# Patient Record
Sex: Female | Born: 2000 | Race: Black or African American | Hispanic: No | Marital: Single | Smoking: Never smoker
Health system: Southern US, Community
[De-identification: ages and names within clinical notes are randomized; demographics above are authoritative.]

---

## 2019-11-29 ENCOUNTER — Other Ambulatory Visit: Payer: Self-pay

## 2019-11-29 ENCOUNTER — Encounter (HOSPITAL_BASED_OUTPATIENT_CLINIC_OR_DEPARTMENT_OTHER): Payer: Self-pay

## 2019-11-29 ENCOUNTER — Emergency Department (HOSPITAL_BASED_OUTPATIENT_CLINIC_OR_DEPARTMENT_OTHER)
Admission: EM | Admit: 2019-11-29 | Discharge: 2019-11-29 | Disposition: A | Discharge status: Home / Self Care | Payer: Self-pay | Attending: Emergency Medicine | Admitting: Emergency Medicine

## 2019-11-29 DIAGNOSIS — R6883 Chills (without fever): Secondary | ICD-10-CM | POA: Insufficient documentation

## 2019-11-29 DIAGNOSIS — R519 Headache, unspecified: Secondary | ICD-10-CM

## 2019-11-29 DIAGNOSIS — Z20822 Contact with and (suspected) exposure to covid-19: Secondary | ICD-10-CM | POA: Insufficient documentation

## 2019-11-29 DIAGNOSIS — R319 Hematuria, unspecified: Secondary | ICD-10-CM

## 2019-11-29 DIAGNOSIS — N390 Urinary tract infection, site not specified: Secondary | ICD-10-CM | POA: Insufficient documentation

## 2019-11-29 LAB — CBC WITH DIFFERENTIAL/PLATELET
Abs Immature Granulocytes: 0.02 10*3/uL (ref 0.00–0.07)
Basophils Absolute: 0 10*3/uL (ref 0.0–0.1)
Basophils Relative: 0 %
Eosinophils Absolute: 0 10*3/uL (ref 0.0–0.5)
Eosinophils Relative: 0 %
HCT: 42.2 % (ref 36.0–46.0)
Hemoglobin: 13.9 g/dL (ref 12.0–15.0)
Immature Granulocytes: 0 %
Lymphocytes Relative: 18 %
Lymphs Abs: 1.6 10*3/uL (ref 0.7–4.0)
MCH: 21.2 pg — ABNORMAL LOW (ref 26.0–34.0)
MCHC: 32.9 g/dL (ref 30.0–36.0)
MCV: 64.3 fL — ABNORMAL LOW (ref 80.0–100.0)
Monocytes Absolute: 0.6 10*3/uL (ref 0.1–1.0)
Monocytes Relative: 6 %
Neutro Abs: 6.6 10*3/uL (ref 1.7–7.7)
Neutrophils Relative %: 76 %
Platelets: 327 10*3/uL (ref 150–400)
RBC: 6.56 MIL/uL — ABNORMAL HIGH (ref 3.87–5.11)
RDW: 16.4 % — ABNORMAL HIGH (ref 11.5–15.5)
WBC: 8.7 10*3/uL (ref 4.0–10.5)
nRBC: 0 % (ref 0.0–0.2)

## 2019-11-29 LAB — SARS CORONAVIRUS 2 BY RT PCR (HOSPITAL ORDER, PERFORMED IN ~~LOC~~ HOSPITAL LAB): SARS Coronavirus 2: NEGATIVE

## 2019-11-29 LAB — BASIC METABOLIC PANEL
Anion gap: 12 (ref 5–15)
BUN: 13 mg/dL (ref 6–20)
CO2: 25 mmol/L (ref 22–32)
Calcium: 9.4 mg/dL (ref 8.9–10.3)
Chloride: 100 mmol/L (ref 98–111)
Creatinine, Ser: 0.88 mg/dL (ref 0.44–1.00)
GFR calc Af Amer: >60 mL/min (ref >60)
GFR calc non Af Amer: >60 mL/min (ref >60)
Glucose, Bld: 93 mg/dL (ref 70–99)
Potassium: 3.6 mmol/L (ref 3.5–5.1)
Sodium: 137 mmol/L (ref 135–145)

## 2019-11-29 LAB — URINALYSIS, ROUTINE W REFLEX MICROSCOPIC
Specific Gravity, Urine: 1.025 (ref 1.005–1.030)
pH: 6.5 (ref 5.0–8.0)

## 2019-11-29 LAB — URINALYSIS, MICROSCOPIC (REFLEX): RBC / HPF: >50 RBC/hpf (ref 0–5)

## 2019-11-29 LAB — PREGNANCY, URINE: Preg Test, Ur: NEGATIVE

## 2019-11-29 MED ORDER — KETOROLAC TROMETHAMINE 15 MG/ML IJ SOLN
15.0000 mg | Freq: Once | INTRAMUSCULAR | Status: AC
  Administered 2019-11-29: 15 mg via INTRAVENOUS
  Filled 2019-11-29: qty 1

## 2019-11-29 MED ORDER — CEPHALEXIN 500 MG PO CAPS: 500.0000 mg | ORAL_CAPSULE | Freq: Four times a day (QID) | ORAL | 0 refills | Status: AC

## 2019-11-29 MED ORDER — DIPHENHYDRAMINE HCL 50 MG/ML IJ SOLN
12.5000 mg | Freq: Once | INTRAMUSCULAR | Status: AC
  Administered 2019-11-29: 12.5 mg via INTRAVENOUS
  Filled 2019-11-29: qty 1

## 2019-11-29 MED ORDER — PROCHLORPERAZINE EDISYLATE 10 MG/2ML IJ SOLN
10.0000 mg | Freq: Once | INTRAMUSCULAR | Status: AC
  Administered 2019-11-29: 10 mg via INTRAVENOUS
  Filled 2019-11-29: qty 2

## 2019-11-29 MED ORDER — SODIUM CHLORIDE 0.9 % IV BOLUS
1000.0000 mL | Freq: Once | INTRAVENOUS | Status: AC
  Administered 2019-11-29: 1000 mL via INTRAVENOUS

## 2019-11-29 MED ORDER — ONDANSETRON 4 MG PO TBDP: 4.0000 mg | ORAL_TABLET | Freq: Three times a day (TID) | ORAL | 0 refills | Status: AC | PRN

## 2019-11-29 NOTE — ED Triage Notes (Signed)
Pt arrives with c/o headache for 2-3 days, c/o fatigue.

## 2019-11-29 NOTE — ED Provider Notes (Signed)
[facility] EMERGENCY DEPARTMENT Provider Note   CSN: [id number] Arrival date & time: 11/29/19  1524     History Chief Complaint  Patient presents with  . Headache    Lori Welch is a 19 y.o. female.  Presents to ER with concern for headache, fatigue, nausea.  Reports symptoms ongoing for the last 2 or 3 days.  Headache is not sudden onset, not worst headache of her life.  Dull achy.  She has no associated neck stiffness, neck pain, fever.  Has had some occasional chills.  No abdominal pain or chest pain.  HPI     History reviewed. No pertinent past medical history.  There are no problems to display for this patient.   History reviewed. No pertinent surgical history.   OB History   No obstetric history on file.     No family history on file.  Social History   Tobacco Use  . Smoking status: Never Smoker  . Smokeless tobacco: Never Used  Vaping Use  . Vaping Use: Never used  Substance Use Topics  . Alcohol use: Yes  . Drug use: Yes    Types: Marijuana    Home Medications Prior to Admission medications   Medication Sig Start Date End Date Taking? Authorizing Provider  cephALEXin (KEFLEX) 500 MG capsule Take 1 capsule (500 mg total) by mouth 4 (four) times daily for 5 days. 11/29/19 12/04/19  Milagros Loll, MD  ondansetron (ZOFRAN ODT) 4 MG disintegrating tablet Take 1 tablet (4 mg total) by mouth every 8 (eight) hours as needed for up to 7 days for nausea or vomiting. 11/29/19 12/06/19  Milagros Loll, MD    Allergies    Patient has no known allergies.  Review of Systems   Review of Systems  Constitutional: Positive for chills and fatigue. Negative for fever.  HENT: Negative for ear pain and sore throat.   Eyes: Negative for pain and visual disturbance.  Respiratory: Negative for cough and shortness of breath.   Cardiovascular: Negative for chest pain and palpitations.  Gastrointestinal: Positive for nausea. Negative for abdominal pain and  vomiting.  Genitourinary: Negative for dysuria and hematuria.  Musculoskeletal: Negative for arthralgias and back pain.  Skin: Negative for color change and rash.  Neurological: Positive for headaches. Negative for seizures and syncope.  All other systems reviewed and are negative.   Physical Exam Updated Vital Signs BP 113/84 (BP Location: Left Arm)   Pulse 95   Temp 98.3 F (36.8 C) (Oral)   Resp 20   Ht 5\' 9"  (1.753 m)   Wt 73 kg   LMP 11/29/2019   SpO2 98%   BMI 23.76 kg/m   Physical Exam Vitals and nursing note reviewed.  Constitutional:      General: She is not in acute distress.    Appearance: She is well-developed.  HENT:     Head: Normocephalic and atraumatic.  Eyes:     Conjunctiva/sclera: Conjunctivae normal.  Cardiovascular:     Rate and Rhythm: Normal rate and regular rhythm.     Heart sounds: No murmur heard.   Pulmonary:     Effort: Pulmonary effort is normal. No respiratory distress.     Breath sounds: Normal breath sounds.  Abdominal:     Palpations: Abdomen is soft.     Tenderness: There is no abdominal tenderness.  Musculoskeletal:     Cervical back: Neck supple.  Skin:    General: Skin is warm and dry.  Neurological:  Mental Status: She is alert.     Comments: AAOx3 CN 2-12 intact, speech clear visual fields intact 5/5 strength in b/l UE and LE Sensation to light touch intact in b/l UE and LE Normal FNF Normal gait  Psychiatric:        Mood and Affect: Mood normal.        Behavior: Behavior normal.     ED Results / Procedures / Treatments   Labs (all labs ordered are listed, but only abnormal results are displayed) Labs Reviewed  CBC WITH DIFFERENTIAL/PLATELET - Abnormal; Notable for the following components:      Result Value   RBC 6.56 (*)    MCV 64.3 (*)    MCH 21.2 (*)    RDW 16.4 (*)    All other components within normal limits  URINALYSIS, ROUTINE W REFLEX MICROSCOPIC - Abnormal; Notable for the following components:     Color, Urine BROWN (*)    APPearance TURBID (*)    Glucose, UA   (*)    Value: TEST NOT REPORTED DUE TO COLOR INTERFERENCE OF URINE PIGMENT   Hgb urine dipstick   (*)    Value: TEST NOT REPORTED DUE TO COLOR INTERFERENCE OF URINE PIGMENT   Bilirubin Urine   (*)    Value: TEST NOT REPORTED DUE TO COLOR INTERFERENCE OF URINE PIGMENT   Ketones, ur   (*)    Value: TEST NOT REPORTED DUE TO COLOR INTERFERENCE OF URINE PIGMENT   Protein, ur   (*)    Value: TEST NOT REPORTED DUE TO COLOR INTERFERENCE OF URINE PIGMENT   Nitrite   (*)    Value: TEST NOT REPORTED DUE TO COLOR INTERFERENCE OF URINE PIGMENT   Leukocytes,Ua   (*)    Value: TEST NOT REPORTED DUE TO COLOR INTERFERENCE OF URINE PIGMENT   All other components within normal limits  URINALYSIS, MICROSCOPIC (REFLEX) - Abnormal; Notable for the following components:   Bacteria, UA MANY (*)    All other components within normal limits  SARS CORONAVIRUS 2 BY RT PCR (HOSPITAL ORDER, PERFORMED IN [facility])  BASIC METABOLIC PANEL  PREGNANCY, URINE    EKG EKG Interpretation  Date/Time:  Monday November 29 2019 19:17:49 EDT Ventricular Rate:  85 PR Interval:    QRS Duration: 86 QT Interval:  383 QTC Calculation: 456 R Axis:   -4 Text Interpretation: Sinus rhythm Normal ECG No old tracing to compare Confirmed by Dione Booze ([id number]) on 11/29/2019 10:08:13 PM   Radiology No results found.  Procedures Procedures (including critical care time)  Medications Ordered in ED Medications  sodium chloride 0.9 % bolus 1,000 mL (0 mLs Intravenous Stopped 11/29/19 2034)  diphenhydrAMINE (BENADRYL) injection 12.5 mg (12.5 mg Intravenous Given 11/29/19 1918)  prochlorperazine (COMPAZINE) injection 10 mg (10 mg Intravenous Given 11/29/19 1917)  ketorolac (TORADOL) 15 MG/ML injection 15 mg (15 mg Intravenous Given 11/29/19 2034)    ED Course  I have reviewed the triage vital signs and the nursing notes.  Pertinent labs & imaging  results that were available during my care of the patient were reviewed by me and considered in my medical decision making (see chart for details).    MDM Rules/Calculators/A&P                         19 year old lady presenting to the emergency room with concern for headache, nausea, fatigue.  On exam patient noted to be remarkably well-appearing, no  distress, grossly normal physical exam.  Provided symptomatic control for her headache.  Labs are grossly normal.  UA concerning for UTI.  Suspect patient has acute viral syndrome.  Will give Rx for UTI.  Recommend recheck with primary doctor.    After the discussed management above, the patient was determined to be safe for discharge.  The patient was in agreement with this plan and all questions regarding their care were answered.  ED return precautions were discussed and the patient will return to the ED with any significant worsening of condition.    Final Clinical Impression(s) / ED Diagnoses Final diagnoses:  Urinary tract infection with hematuria, site unspecified  Nonintractable headache, unspecified chronicity pattern, unspecified headache type    Rx / DC Orders ED Discharge Orders         Ordered    cephALEXin (KEFLEX) 500 MG capsule  4 times daily        11/29/19 2204    ondansetron (ZOFRAN ODT) 4 MG disintegrating tablet  Every 8 hours PRN        11/29/19 2204           Milagros Loll, MD 11/29/19 2359

## 2019-11-29 NOTE — Discharge Instructions (Signed)
Take Zofran as needed for nausea.  Take Keflex for your urinary tract infection.  If your headache worsens, you develop vomiting, fever, neck stiffness or other new concerning symptom, please return immediately to the emergency room for reevaluation.  Strongly recommend close follow-up with your primary doctor.

## 2019-12-14 ENCOUNTER — Emergency Department (HOSPITAL_COMMUNITY): Payer: Self-pay

## 2019-12-14 ENCOUNTER — Encounter (HOSPITAL_COMMUNITY): Payer: Self-pay

## 2019-12-14 ENCOUNTER — Emergency Department (HOSPITAL_COMMUNITY)
Admission: EM | Admit: 2019-12-14 | Discharge: 2019-12-14 | Disposition: A | Discharge status: Home / Self Care | Payer: Self-pay | Attending: Emergency Medicine | Admitting: Emergency Medicine

## 2019-12-14 ENCOUNTER — Other Ambulatory Visit: Payer: Self-pay

## 2019-12-14 DIAGNOSIS — Z5321 Procedure and treatment not carried out due to patient leaving prior to being seen by health care provider: Secondary | ICD-10-CM | POA: Insufficient documentation

## 2019-12-14 DIAGNOSIS — R0602 Shortness of breath: Secondary | ICD-10-CM | POA: Insufficient documentation

## 2019-12-14 DIAGNOSIS — Z20822 Contact with and (suspected) exposure to covid-19: Secondary | ICD-10-CM | POA: Insufficient documentation

## 2019-12-14 LAB — I-STAT BETA HCG BLOOD, ED (MC, WL, AP ONLY): I-stat hCG, quantitative: <5 m[IU]/mL (ref <5)

## 2019-12-14 LAB — SARS CORONAVIRUS 2 BY RT PCR (HOSPITAL ORDER, PERFORMED IN ~~LOC~~ HOSPITAL LAB): SARS Coronavirus 2: NEGATIVE

## 2019-12-14 MED ORDER — ACETAMINOPHEN 325 MG PO TABS: 650.0000 mg | ORAL_TABLET | Freq: Once | ORAL | Status: DC

## 2019-12-14 NOTE — ED Triage Notes (Signed)
Patient arrived with complaints of shortness of breath, shaking, and "a weird taste in her mouth"  Patient works in [employer], unvaccinated for covid-19.

## 2020-11-04 IMAGING — CR DG CHEST 2V
2 series · 2 of 2 positions shown · non-contrast
Comparison: None.

CLINICAL DATA: Increasing shortness of breath since 12/10/2019

EXAM:
CHEST - 2 VIEW

[w chest pa]
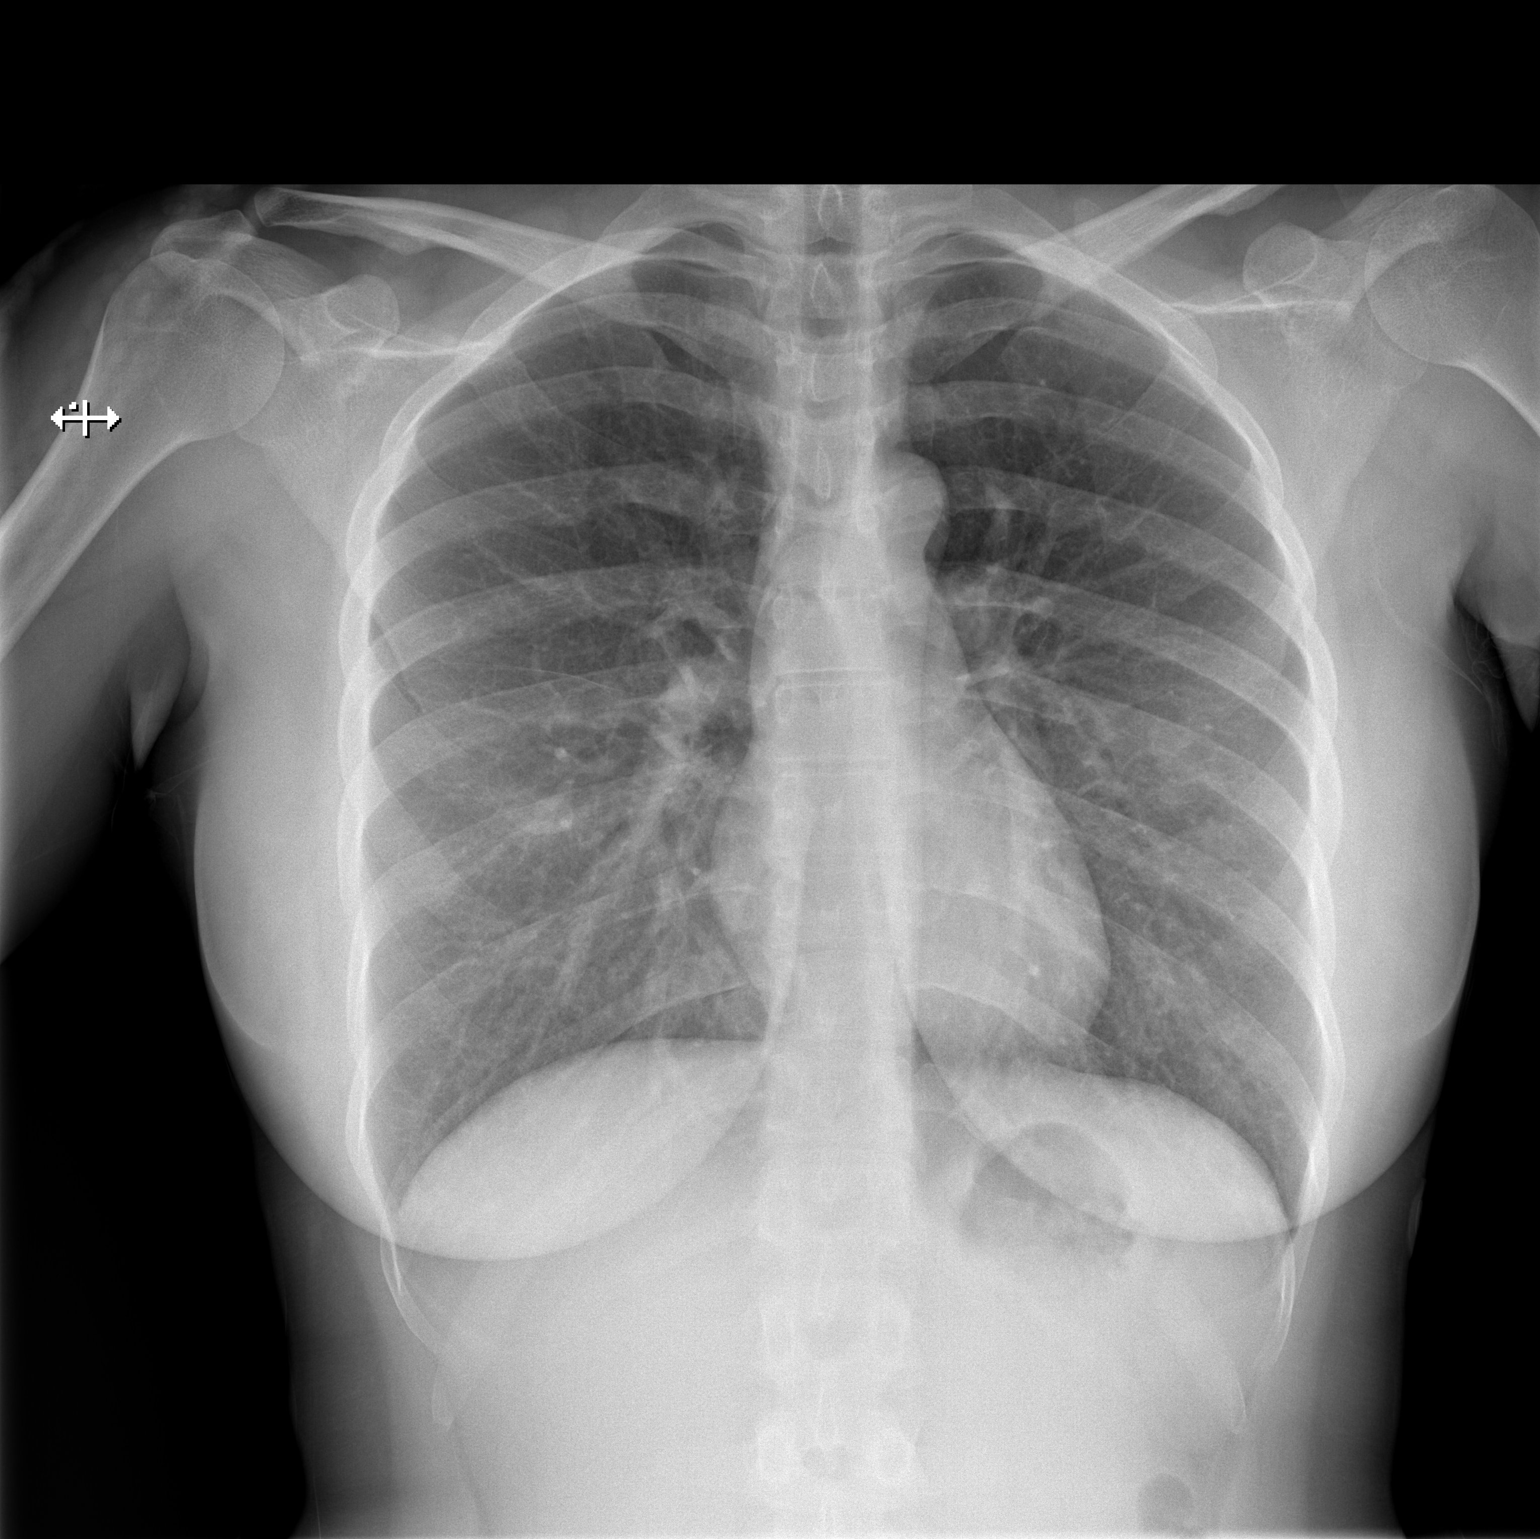

[w chest lat]
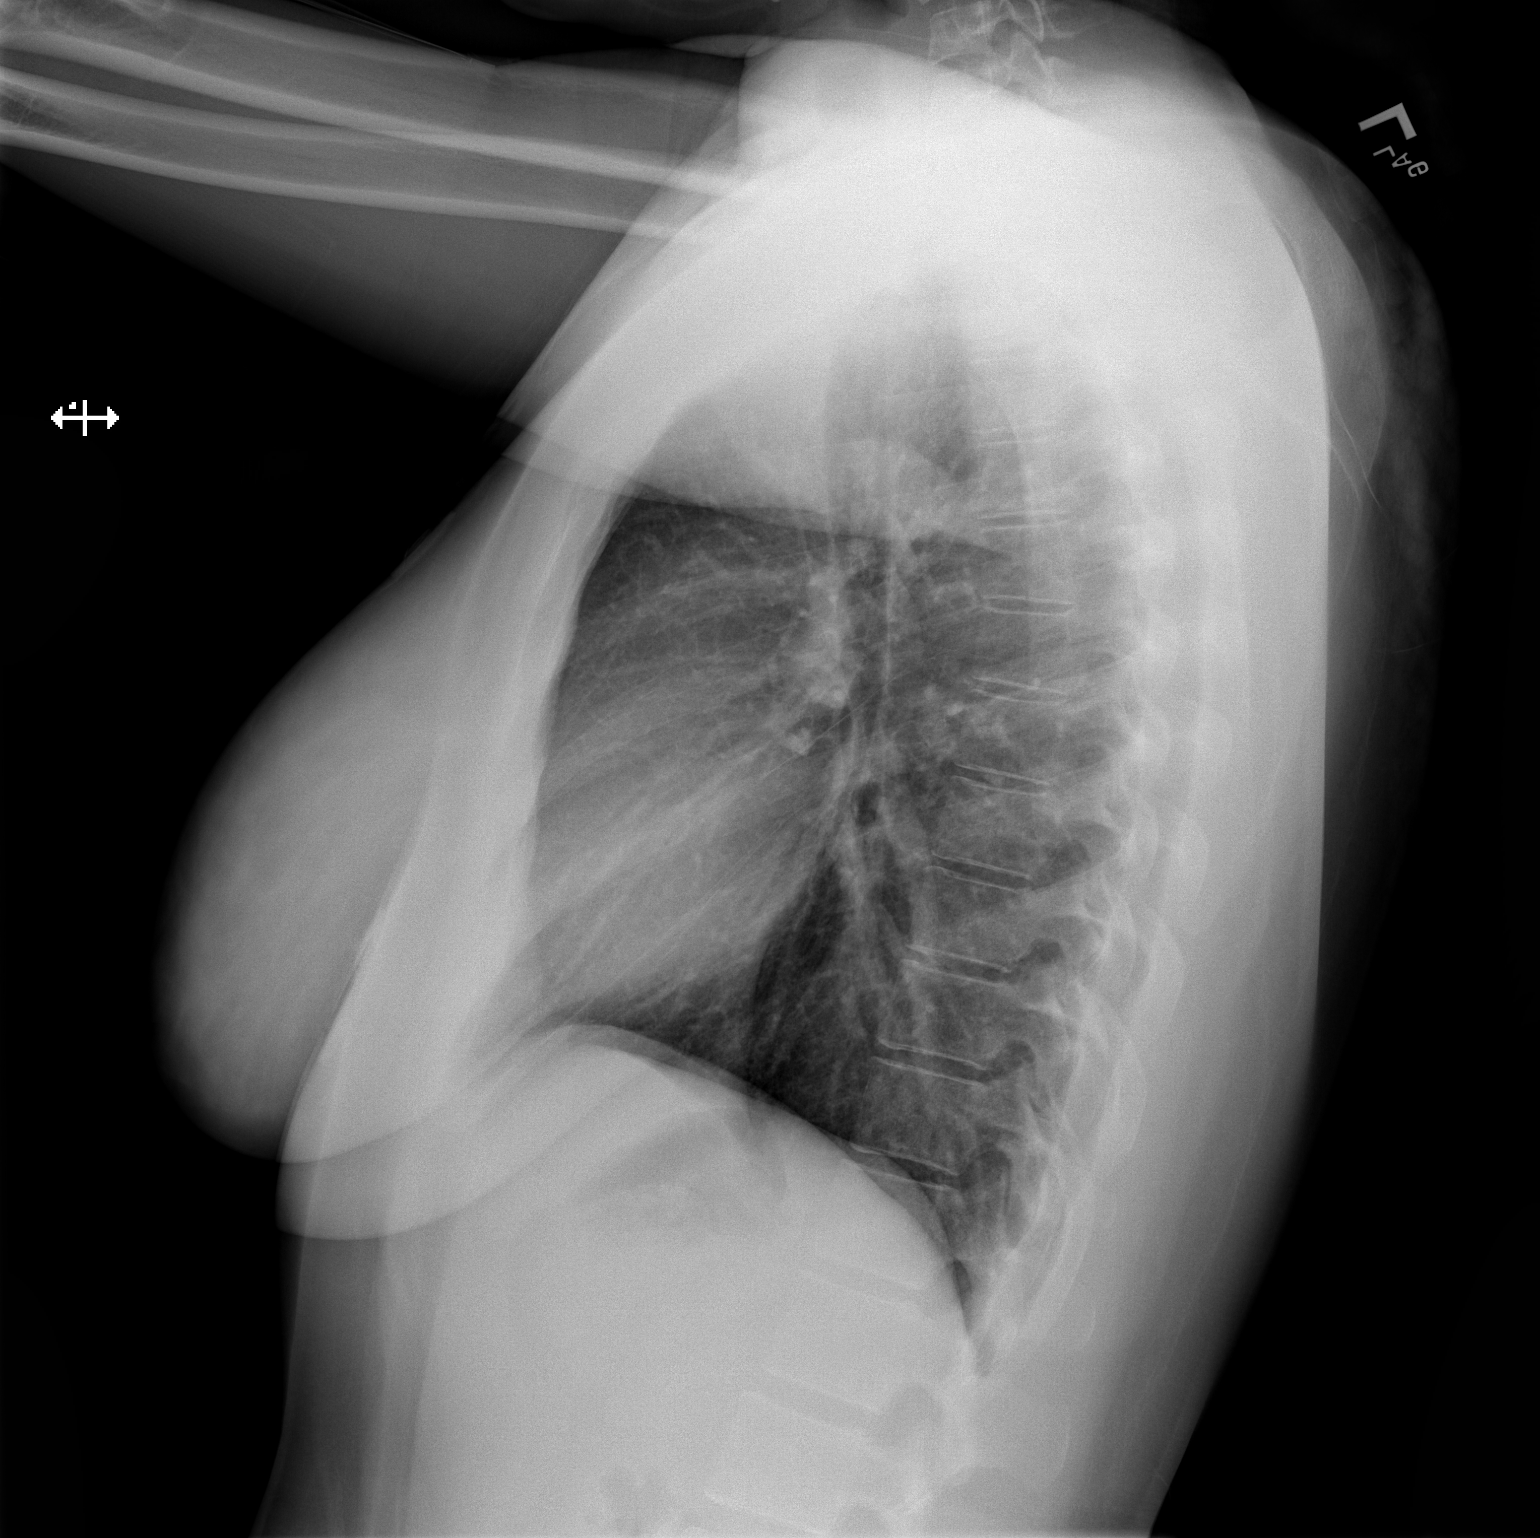

[2 of 2 positions shown; findings below may reference images not displayed]

FINDINGS: The heart size and mediastinal contours are within normal limits.
Both lungs are clear. The visualized skeletal structures are
unremarkable.
IMPRESSION: No active cardiopulmonary disease.
